# Patient Record
Sex: Female | Born: 2000 | Race: Black or African American | Hispanic: No | Marital: Single | Smoking: Never smoker
Health system: Southern US, Community
[De-identification: ages and names within clinical notes are randomized; demographics above are authoritative.]

## PROBLEM LIST (undated history)

## (undated) DIAGNOSIS — J302 Other seasonal allergic rhinitis: Secondary | ICD-10-CM

---
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
## 2022-05-16 ENCOUNTER — Emergency Department (HOSPITAL_COMMUNITY): Payer: Medicaid Other

## 2022-05-16 ENCOUNTER — Emergency Department (HOSPITAL_COMMUNITY)
Admission: EM | Admit: 2022-05-16 | Discharge: 2022-05-16 | Disposition: A | Discharge status: Home / Self Care | Payer: Medicaid Other | Attending: Emergency Medicine | Admitting: Emergency Medicine

## 2022-05-16 ENCOUNTER — Encounter (HOSPITAL_COMMUNITY): Payer: Self-pay | Admitting: Emergency Medicine

## 2022-05-16 ENCOUNTER — Other Ambulatory Visit: Payer: Self-pay

## 2022-05-16 DIAGNOSIS — S93491A Sprain of other ligament of right ankle, initial encounter: Secondary | ICD-10-CM

## 2022-05-16 DIAGNOSIS — W109XXA Fall (on) (from) unspecified stairs and steps, initial encounter: Secondary | ICD-10-CM | POA: Diagnosis not present

## 2022-05-16 DIAGNOSIS — S99911A Unspecified injury of right ankle, initial encounter: Secondary | ICD-10-CM | POA: Diagnosis present

## 2022-05-16 NOTE — ED Triage Notes (Signed)
Pt states that she fell down 3 steps and twisted her right ankle.

## 2022-05-16 NOTE — Discharge Instructions (Signed)
Wear an ankle brace for support, though you may remove this to sleep or shower.  Apply ice to areas of injury 3-4 times per day to limit inflammation. We recommend consistent use of tylenol or ibuprofen for pain.  We recommend follow-up with a primary care doctor to ensure resolution of symptoms.  Return to the ED for any new or concerning symptoms.

## 2022-05-16 NOTE — ED Provider Notes (Signed)
[facility] Provider Note   CSN: [id number] Arrival date & time: 05/16/22  0143     History  Chief Complaint  Patient presents with   Ankle Pain    Lynn Terry is a 22 y.o. female.  The history is provided by the patient. No language interpreter was used.  Ankle Pain Location:  Ankle Time since incident:  2 hours Injury: yes   Mechanism of injury comment:  Stumpled down some stairs Ankle location:  R ankle Pain details:    Quality:  Aching   Radiates to:  Does not radiate   Severity:  Moderate   Onset quality:  Sudden   Duration:  2 hours   Timing:  Constant   Progression:  Unchanged Chronicity:  New Prior injury to area:  No Relieved by:  Nothing Worsened by:  Bearing weight Ineffective treatments:  Rest Associated symptoms: swelling   Associated symptoms: no muscle weakness, no numbness and no tingling   Risk factors: obesity        Home Medications Prior to Admission medications   Medication Sig Start Date End Date Taking? Authorizing Provider  cetirizine (ZYRTEC ALLERGY) 10 MG tablet Take 1 tablet (10 mg total) by mouth daily. 02/20/21   Ward, Lenise Arena, PA-C  diphenhydrAMINE (BENADRYL) 25 MG tablet Take 25 mg by mouth every 6 (six) hours as needed.    [provider]  olopatadine (PATANOL) 0.1 % ophthalmic solution Place 1 drop into both eyes 2 (two) times daily. 02/05/12   Mabe, Forbes Cellar, MD  Olopatadine HCl 0.2 % SOLN Place 1 drop into both eyes daily.    [provider]  predniSONE (STERAPRED UNI-PAK 21 TAB) 10 MG (21) TBPK tablet Take by mouth daily. As directed 07/05/20   Sharion Balloon, NP  fluticasone [facility]) 50 MCG/ACT nasal spray Place 1 spray into the nose daily as needed. For nasal congestion  07/05/20  [provider]      Allergies    Ibuprofen    Review of Systems   Review of Systems Ten systems reviewed and are negative for acute change, except as noted in the  HPI.    Physical Exam Updated Vital Signs BP (!) 172/98   Pulse (!) 112   Temp 97.9 F (36.6 C)   Resp 18   Ht 5' 8"$  (1.727 m)   Wt 104.3 kg   SpO2 100%   BMI 34.97 kg/m   Physical Exam Vitals and nursing note reviewed.  Constitutional:      General: She is not in acute distress.    Appearance: She is well-developed. She is not diaphoretic.  HENT:     Head: Normocephalic and atraumatic.  Eyes:     General: No scleral icterus.    Conjunctiva/sclera: Conjunctivae normal.  Cardiovascular:     Rate and Rhythm: Normal rate and regular rhythm.     Pulses: Normal pulses.     Comments: DP pulse 2+ in the RLE Pulmonary:     Effort: Pulmonary effort is normal. No respiratory distress.  Musculoskeletal:        General: Normal range of motion.     Cervical back: Normal range of motion.     Right ankle: Swelling present. Tenderness present over the ATF ligament. Normal range of motion. Normal pulse.  Skin:    General: Skin is warm and dry.     Coloration: Skin is not pale.     Findings: No erythema or rash.  Neurological:     Mental Status: She is alert and oriented to person, place, and time.     Coordination: Coordination normal.  Psychiatric:        Behavior: Behavior normal.     ED Results / Procedures / Treatments   Labs (all labs ordered are listed, but only abnormal results are displayed) Labs Reviewed - No data to display  EKG None  Radiology DG Ankle Complete Right  Result Date: 05/16/2022 CLINICAL DATA:  Right ankle pain EXAM: RIGHT ANKLE - COMPLETE 3+ VIEW COMPARISON:  None Available. FINDINGS: There is no evidence of fracture, dislocation, or joint effusion. There is no evidence of arthropathy or other focal bone abnormality. Soft tissues are unremarkable. IMPRESSION: Negative. Electronically Signed   By: Fidela Salisbury M.D.   On: 05/16/2022 02:14    Procedures Procedures    Medications Ordered in ED Medications - No data to display  ED Course/  Medical Decision Making/ A&P                             Medical Decision Making Amount and/or Complexity of Data Reviewed Radiology: ordered.   This patient presents to the ED for concern of R ankle pain, this involves an extensive number of treatment options, and is a complaint that carries with it a high risk of complications and morbidity.  The differential diagnosis includes fracture vs dislocation vs sprain/strain   Co morbidities that complicate the patient evaluation  Obesity    Imaging Studies ordered:  I ordered imaging studies including Xray R ankle  I independently visualized and interpreted imaging which showed no fracture or dislocation I agree with the radiologist interpretation   Cardiac Monitoring:  The patient was maintained on a cardiac monitor.  I personally viewed and interpreted the cardiac monitored which showed an underlying rhythm of: NSR   Medicines ordered and prescription drug management:  I have reviewed the patients home medicines and have made adjustments as needed   Problem List / ED Course:  Patient neurovascularly intact on exam.  Imaging negative for fracture, dislocation, bony deformity.  No erythema, heat to touch to the affected area; no concern for septic joint.  Compartments in the affected extremity are soft.  Plan for supportive management including RICE and NSAIDs. ASO ankle brace applied in ED   Reevaluation:  After the interventions noted above, I reevaluated the patient and found that they have :stayed the same   Social Determinants of Health:  Insured patient   Dispostion:  After consideration of the diagnostic results and the patients response to treatment, I feel that the patent would benefit from RICE and NSAIDs with PCP follow up. Return precautions discussed and provided. Patient discharged in stable condition with no unaddressed concerns.          Final Clinical Impression(s) / ED Diagnoses Final  diagnoses:  Sprain of anterior talofibular ligament of right ankle, initial encounter    Rx / DC Orders ED Discharge Orders     None         Antonietta Breach, PA-C 05/16/22 0232    Orpah Greek, MD 05/16/22 [phone]

## 2022-07-05 ENCOUNTER — Ambulatory Visit: Payer: Medicaid Other | Admitting: Obstetrics & Gynecology

## 2022-08-30 ENCOUNTER — Ambulatory Visit: Payer: Medicaid Other | Admitting: Obstetrics and Gynecology

## 2024-01-24 ENCOUNTER — Ambulatory Visit (INDEPENDENT_AMBULATORY_CARE_PROVIDER_SITE_OTHER)

## 2024-01-24 ENCOUNTER — Ambulatory Visit: Payer: Self-pay | Admitting: Urgent Care

## 2024-01-24 ENCOUNTER — Ambulatory Visit
Admission: EM | Admit: 2024-01-24 | Discharge: 2024-01-24 | Disposition: A | Discharge status: Home / Self Care | Attending: Family Medicine | Admitting: Family Medicine

## 2024-01-24 DIAGNOSIS — M25511 Pain in right shoulder: Secondary | ICD-10-CM

## 2024-01-24 DIAGNOSIS — S46911A Strain of unspecified muscle, fascia and tendon at shoulder and upper arm level, right arm, initial encounter: Secondary | ICD-10-CM

## 2024-01-24 MED ORDER — PREDNISONE 10 MG PO TABS: 30.0000 mg | ORAL_TABLET | Freq: Every day | ORAL | 0 refills | Status: AC

## 2024-01-24 MED ORDER — CYCLOBENZAPRINE HCL 5 MG PO TABS: 5.0000 mg | ORAL_TABLET | Freq: Every evening | ORAL | 0 refills | Status: AC | PRN

## 2024-01-24 NOTE — ED Provider Notes (Signed)
 [facility]  Note:  This document was prepared using Conservation officer, historic buildings and may include unintentional dictation errors.  MRN: [id number] DOB: 11-13-2000  Subjective:   Lynn Terry is a 23 y.o. female presenting for 2-day history of persistent right shoulder pain. Was horse-playing in the car with one of her friends.  She suddenly extended and rotated her right arm and felt her shoulder lock in place with associated severe pain.  As she tried to move it she felt a pop and felt like it went back into place.  Has continued to hurt.  No bruising, swelling.  She is slowly regaining her range of motion but is painful.  No current facility-administered medications for this encounter.  Current Outpatient Medications:    cetirizine  (ZYRTEC  ALLERGY) 10 MG tablet, Take 1 tablet (10 mg total) by mouth daily., Disp: 30 tablet, Rfl: 0   diphenhydrAMINE (BENADRYL) 25 MG tablet, Take 25 mg by mouth every 6 (six) hours as needed., Disp: , Rfl:    olopatadine  (PATANOL) 0.1 % ophthalmic solution, Place 1 drop into both eyes 2 (two) times daily., Disp: 5 mL, Rfl: 12   Olopatadine  HCl 0.2 % SOLN, Place 1 drop into both eyes daily., Disp: , Rfl:    predniSONE  (STERAPRED UNI-PAK 21 TAB) 10 MG (21) TBPK tablet, Take by mouth daily. As directed, Disp: 21 tablet, Rfl: 0   Allergies  Allergen Reactions   Ibuprofen Hives and Rash    Past Medical History:  Diagnosis Date   Seasonal allergies      History reviewed. No pertinent surgical history.  Family History  Problem Relation Age of Onset   Hypertension Mother    Healthy Father     Social History   Tobacco Use   Smoking status: Never   Smokeless tobacco: Never  Substance Use Topics   Alcohol use: No   Drug use: No    ROS   Objective:   Vitals: BP 126/82   Pulse 86   Temp 97.8 F (36.6 C) (Oral)   Resp 18   LMP 01/22/2024 (Exact Date)   SpO2 99%   Physical Exam Constitutional:       General: She is not in acute distress.    Appearance: Normal appearance. She is well-developed. She is not ill-appearing, toxic-appearing or diaphoretic.  HENT:     Head: Normocephalic and atraumatic.     Nose: Nose normal.     Mouth/Throat:     Mouth: Mucous membranes are moist.  Eyes:     General: No scleral icterus.       Right eye: No discharge.        Left eye: No discharge.     Extraocular Movements: Extraocular movements intact.  Cardiovascular:     Rate and Rhythm: Normal rate.  Pulmonary:     Effort: Pulmonary effort is normal.  Musculoskeletal:     Right shoulder: Tenderness (+ Hawkins and Neer tests) present. No swelling, deformity, effusion, laceration, bony tenderness or crepitus. Decreased range of motion (external rotation, abduction). Normal strength.  Skin:    General: Skin is warm and dry.  Neurological:     General: No focal deficit present.     Mental Status: She is alert and oriented to person, place, and time.  Psychiatric:        Mood and Affect: Mood normal.        Behavior: Behavior normal.    DG Shoulder Right Result Date: 01/24/2024 EXAM: 3 VIEW(S)  XRAY OF THE RIGHT SHOULDER 01/24/2024 09:15:37 AM COMPARISON: None available. CLINICAL HISTORY: right shoulder pain. Right shoulder pain FINDINGS: BONES AND JOINTS: Glenohumeral joint is normally aligned. No acute fracture or dislocation. [facility] joint is unremarkable in appearance. SOFT TISSUES: No abnormal calcifications. Visualized lung is unremarkable. IMPRESSION: 1. No acute osseous abnormality of the shoulder. Electronically signed by: Ryan Chess MD 01/24/2024 09:42 AM EDT RP Workstation: [id number]    Assessment and Plan :   PDMP not reviewed this encounter.  1. Right shoulder strain, initial encounter   2. Acute pain of right shoulder    Suspect patient strained her shoulder.  However given possibility of rotator cuff injury, labrum injury and her overall pain recommended an oral prednisone   course (has NSAID allergy).  Also recommend a muscle relaxant.  Follow-up with an orthopedist soon as possible to pursue further imaging as deemed appropriate.  Counseled patient on potential for adverse effects with medications prescribed/recommended today, ER and return-to-clinic precautions discussed, patient verbalized understanding.    Christopher Savannah, [place] 01/24/24 1114

## 2024-01-24 NOTE — ED Triage Notes (Signed)
 Pt present with rt shoulder pain x two days. Pt states she was playing and when she swung her arm her rt shoulder popped. Pt states it hurts to move it.

## 2024-01-24 NOTE — Discharge Instructions (Signed)
 I suspect you strained your shoulder but it is possible your suffered a soft tissue injury. This includes things like rotator cuff tear or labrum tear. Please use prednisone  for pain and inflammation. Use cyclobenzaprine as a muscle relaxant at bedtime. Follow up with an orthopedist asap.
